# Patient Record
Sex: Female | Born: 1975 | Race: White | Hispanic: No | Marital: Single
Health system: Southern US, Community
[De-identification: ages and names within clinical notes are randomized; demographics above are authoritative.]

---

## 2013-08-09 LAB — OB RESULTS CONSOLE HGB/HCT, BLOOD
HCT: 38 %
Hemoglobin: 13.1 g/dL

## 2013-08-09 LAB — OB RESULTS CONSOLE ABO/RH: RH TYPE: POSITIVE

## 2013-08-09 LAB — OB RESULTS CONSOLE RUBELLA ANTIBODY, IGM: Rubella: IMMUNE

## 2013-08-09 LAB — OB RESULTS CONSOLE RPR: RPR: NONREACTIVE

## 2013-08-09 LAB — OB RESULTS CONSOLE HEPATITIS B SURFACE ANTIGEN: HEP B S AG: NEGATIVE

## 2013-08-09 LAB — OB RESULTS CONSOLE HIV ANTIBODY (ROUTINE TESTING): HIV: NONREACTIVE

## 2013-08-09 LAB — OB RESULTS CONSOLE PLATELET COUNT: Platelets: 260 10*3/uL

## 2013-08-09 LAB — OB RESULTS CONSOLE ANTIBODY SCREEN: Antibody Screen: NEGATIVE

## 2013-11-22 ENCOUNTER — Other Ambulatory Visit: Payer: Medicaid Other

## 2013-11-22 DIAGNOSIS — Z3490 Encounter for supervision of normal pregnancy, unspecified, unspecified trimester: Secondary | ICD-10-CM

## 2013-11-22 DIAGNOSIS — Z3689 Encounter for other specified antenatal screening: Secondary | ICD-10-CM

## 2013-11-22 LAB — CBC
HCT: 37 % (ref 36.0–46.0)
HEMOGLOBIN: 13.1 g/dL (ref 12.0–15.0)
MCH: 29.9 pg (ref 26.0–34.0)
MCHC: 35.4 g/dL (ref 30.0–36.0)
MCV: 84.5 fL (ref 78.0–100.0)
Platelets: 253 10*3/uL (ref 150–400)
RBC: 4.38 MIL/uL (ref 3.87–5.11)
RDW: 13.3 % (ref 11.5–15.5)
WBC: 11.7 10*3/uL — ABNORMAL HIGH (ref 4.0–10.5)

## 2013-11-22 NOTE — Progress Notes (Unsigned)
Anatomy US scheduled for July 24th

## 2013-11-23 LAB — HIV ANTIBODY (ROUTINE TESTING W REFLEX): HIV 1&2 Ab, 4th Generation: NONREACTIVE

## 2013-11-23 LAB — GLUCOSE TOLERANCE, 1 HOUR (50G) W/O FASTING: Glucose, 1 Hour GTT: 92 mg/dL (ref 70–140)

## 2013-11-23 LAB — RPR

## 2013-11-24 DIAGNOSIS — O09522 Supervision of elderly multigravida, second trimester: Secondary | ICD-10-CM

## 2013-11-24 DIAGNOSIS — O09529 Supervision of elderly multigravida, unspecified trimester: Secondary | ICD-10-CM | POA: Insufficient documentation

## 2013-11-24 DIAGNOSIS — D219 Benign neoplasm of connective and other soft tissue, unspecified: Secondary | ICD-10-CM | POA: Insufficient documentation

## 2013-11-24 DIAGNOSIS — I471 Supraventricular tachycardia: Secondary | ICD-10-CM | POA: Insufficient documentation

## 2013-11-24 DIAGNOSIS — D259 Leiomyoma of uterus, unspecified: Secondary | ICD-10-CM

## 2013-11-24 DIAGNOSIS — E039 Hypothyroidism, unspecified: Secondary | ICD-10-CM | POA: Insufficient documentation

## 2013-11-26 ENCOUNTER — Other Ambulatory Visit: Payer: Self-pay | Admitting: Obstetrics & Gynecology

## 2013-11-26 ENCOUNTER — Ambulatory Visit (HOSPITAL_COMMUNITY)
Admission: RE | Admit: 2013-11-26 | Discharge: 2013-11-26 | Disposition: A | Discharge status: Home / Self Care | Payer: Medicaid Other | Source: Ambulatory Visit | Attending: Obstetrics & Gynecology | Admitting: Obstetrics & Gynecology

## 2013-11-26 DIAGNOSIS — Z3689 Encounter for other specified antenatal screening: Secondary | ICD-10-CM

## 2013-12-01 ENCOUNTER — Encounter: Payer: Self-pay | Admitting: *Deleted

## 2013-12-02 ENCOUNTER — Telehealth: Payer: Self-pay | Admitting: General Practice

## 2013-12-02 NOTE — Telephone Encounter (Signed)
Patient called and left message stating she had labs and an ultrasound done over a week ago and would like her results. Called patient, no answer- left message that we are trying to return your phone call, please call us back at the clinics

## 2013-12-02 NOTE — Telephone Encounter (Signed)
Patient called back and left message stating she is trying to get her results. Called patient back and informed her of normal results and ultrasound. Patient verbalized understanding and had no other questions

## 2013-12-09 ENCOUNTER — Encounter: Payer: Medicaid Other | Admitting: Family

## 2014-01-04 ENCOUNTER — Encounter: Payer: Self-pay | Admitting: Obstetrics & Gynecology

## 2014-01-04 ENCOUNTER — Encounter: Payer: Medicaid Other | Admitting: Obstetrics & Gynecology

## 2014-01-11 ENCOUNTER — Encounter: Payer: Self-pay | Admitting: General Practice

## 2014-09-29 ENCOUNTER — Encounter (HOSPITAL_COMMUNITY): Payer: Self-pay | Admitting: *Deleted

## 2016-06-28 IMAGING — US US OB DETAIL+14 WK
1 series · 12 of 28 positions shown · non-contrast
Comparison: none

[Series 1: us ob comp +14 wk · 74 acquisitions, 12 frames shown]
[im 3/74]
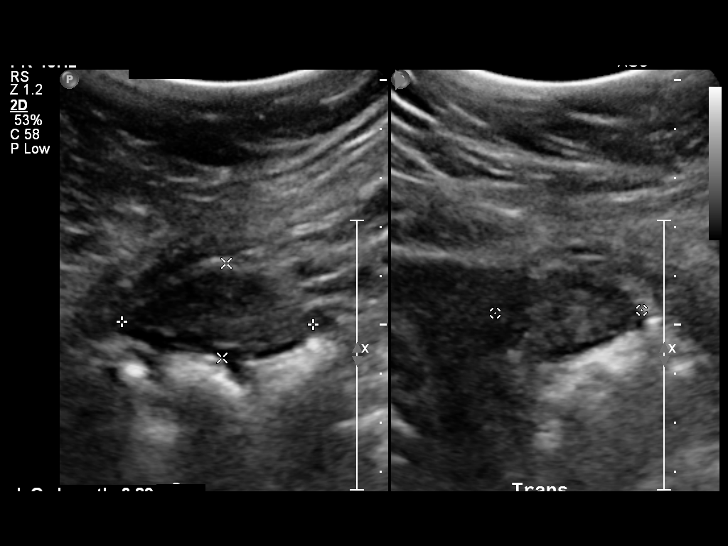
[im 9/74]
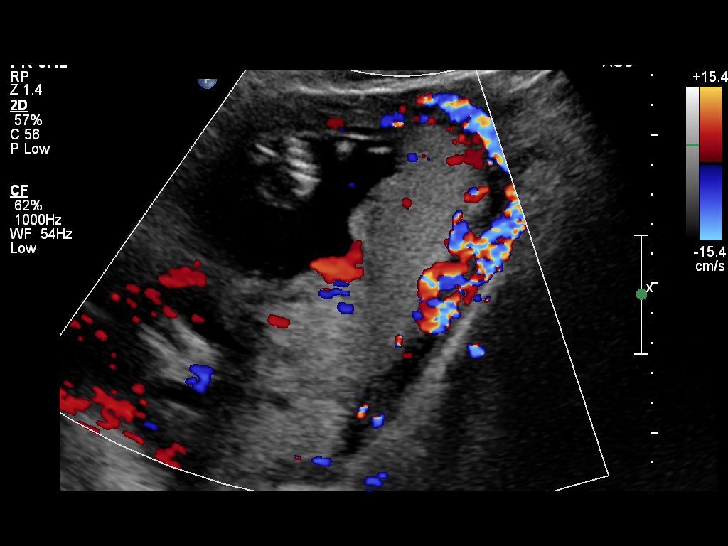
[im 14/74]
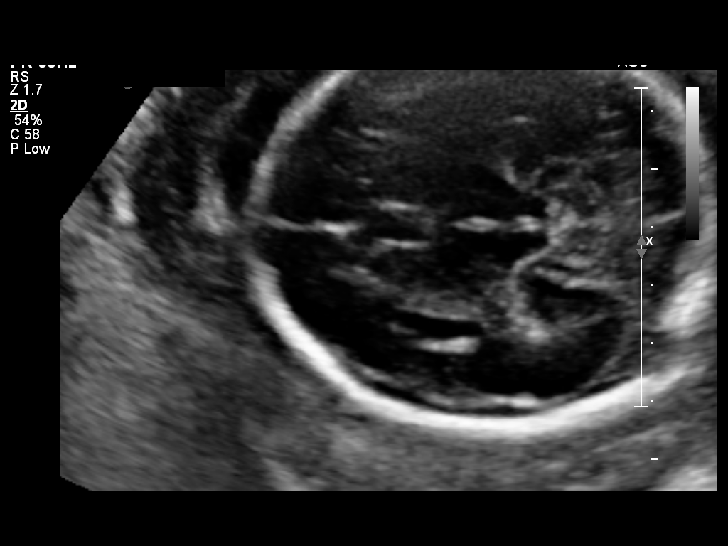
[im 22/74]
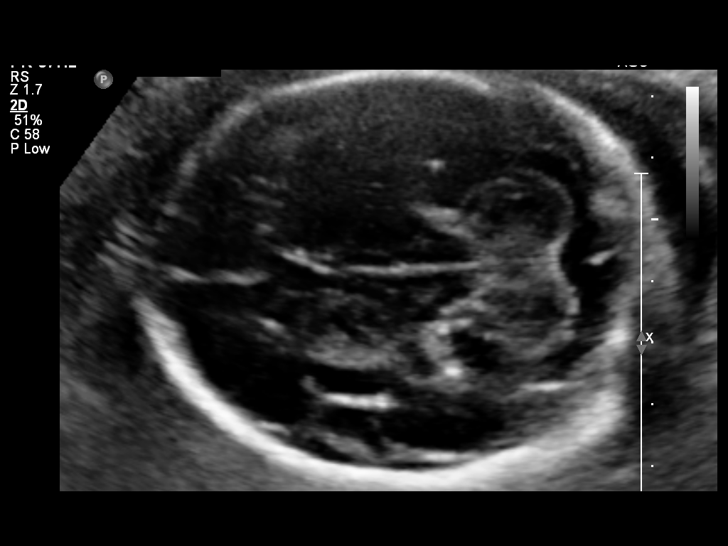
[im 28/74]
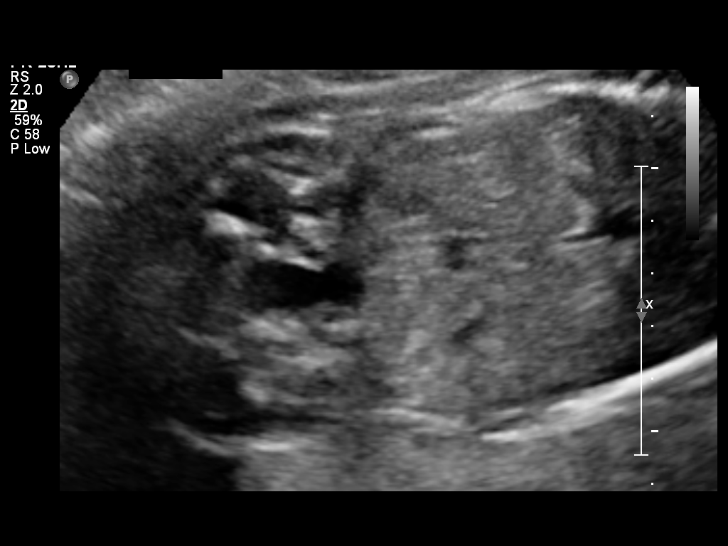
[im 33/74]
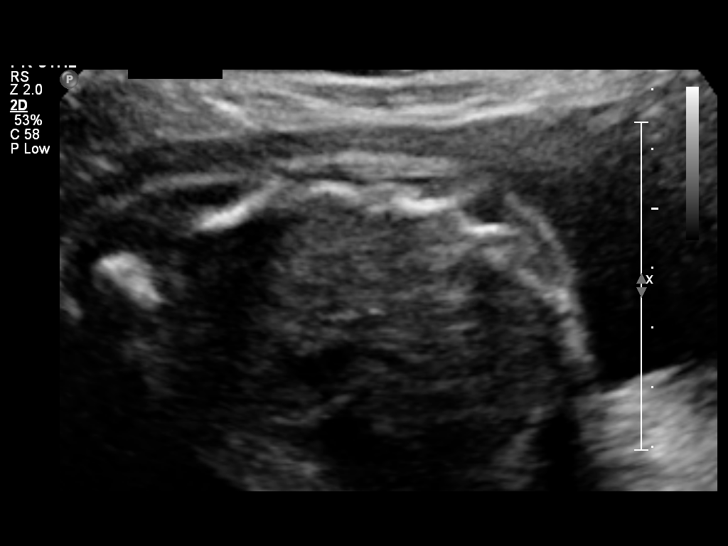
[im 41/74]
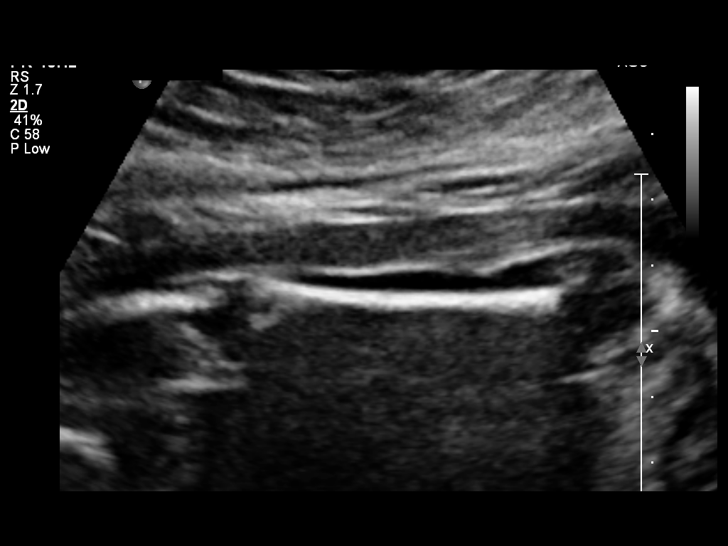
[im 46/74]
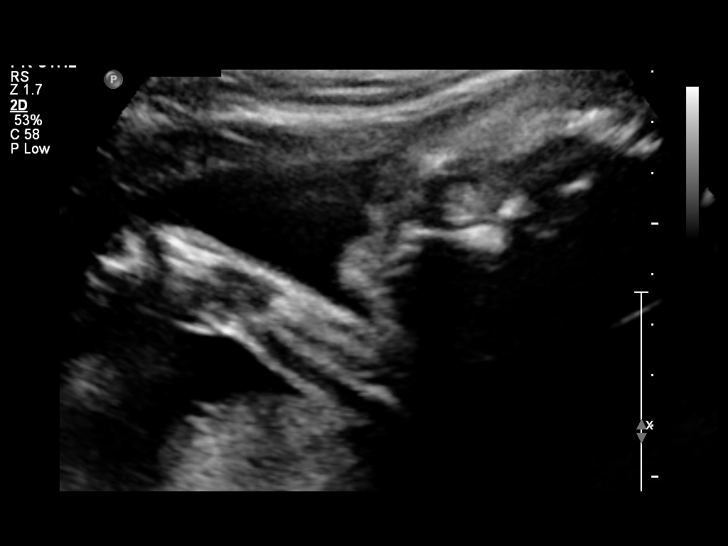
[im 52/74]
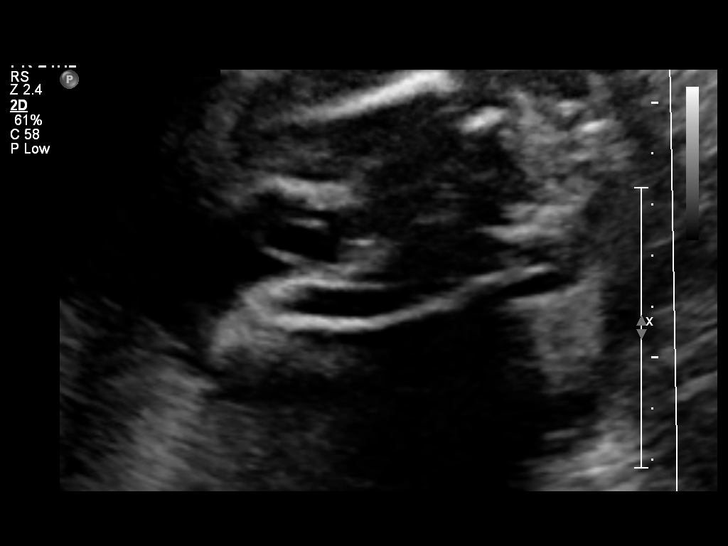
[im 60/74]
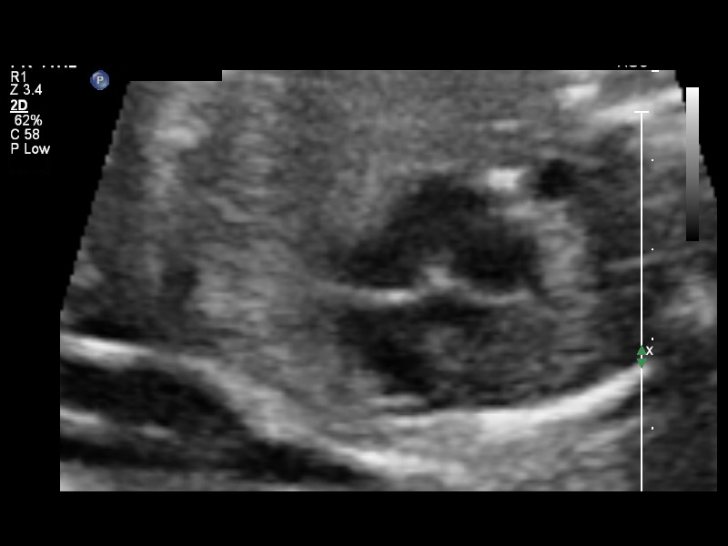
[im 65/74]
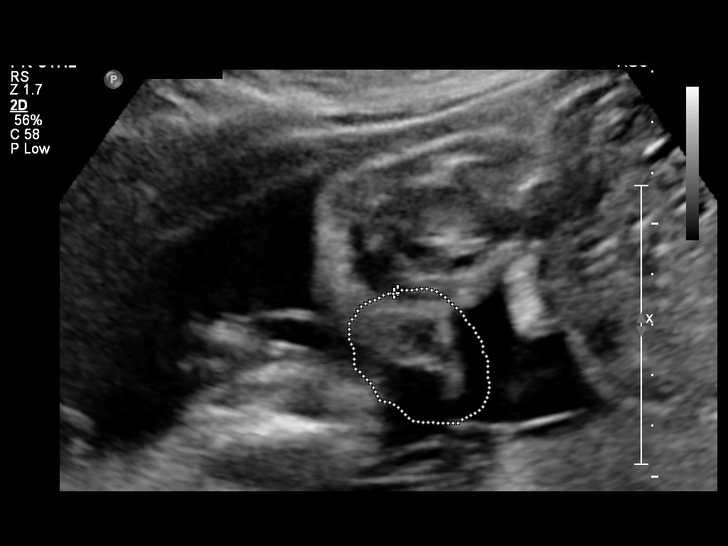
[im 71/74]
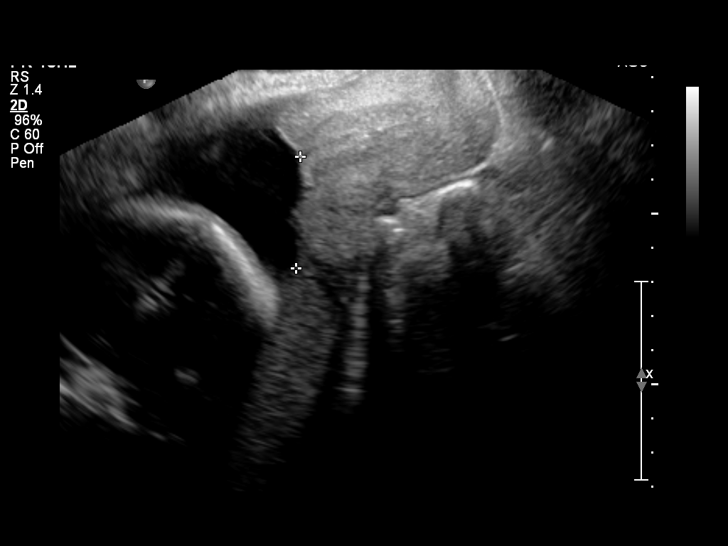

[12 of 28 positions shown; findings below may reference images not displayed]

OBSTETRICS REPORT
                    (Corrected Final 12/16/2013 [DATE])

Service(s) Provided

 US OB DETAIL + 14 WK                                  76811.0
 US MFM OB TRANSVAGINAL                                76817.2
Indications

 Basic anatomic survey
 Advanced maternal age (AMA), Primigravida
 Placenta previa/Low lying: No bleeding
Fetal Evaluation

 Num Of Fetuses:    1
 Fetal Heart Rate:  152                          bpm
 Cardiac Activity:  Observed
 Presentation:      Cephalic
 Placenta:          Posterior, above cervical
                    os
 P. Cord            Visualized, central
 Insertion:

 Amniotic Fluid
 AFI FV:      Subjectively within normal limits
                                             Larg Pckt:    4.62  cm
Biometry

 BPD:     67.1  mm     G. Age:  27w 0d                CI:        73.13   70 - 86
                                                      FL/HC:      20.6   18.6 -

 HC:     249.4  mm     G. Age:  27w 0d       41  %    HC/AC:      1.10   1.05 -

 AC:     225.9  mm     G. Age:  27w 0d       55  %    FL/BPD:     76.6   71 - 87
 FL:      51.4  mm     G. Age:  27w 3d       63  %    FL/AC:      22.8   20 - 24
 HUM:     46.9  mm     G. Age:  27w 4d       70  %
 CER:     32.4  mm     G. Age:  28w 3d       80  %

 Est. FW:    8441  gm      2 lb 5 oz     66  %
Gestational Age

 LMP:           27w 0d        Date:  05/21/13                 EDD:   02/25/14
 Clinical EDD:  26w 4d                                        EDD:   02/28/14
 U/S Today:     27w 1d                                        EDD:   02/24/14
 Best:          26w 4d     Det. By:  Clinical EDD             EDD:   02/28/14
Anatomy

 Cranium:          Appears normal         Aortic Arch:      Not well visualized
 Fetal Cavum:      Appears normal         Ductal Arch:      Not well visualized
 Ventricles:       Appears normal         Diaphragm:        Not well visualized
 Choroid Plexus:   Not well visualized    Stomach:          Appears normal, left
                                                            sided
 Cerebellum:       Appears normal         Abdomen:          Appears normal
 Posterior Fossa:  Appears normal         Abdominal Wall:   Appears nml (cord
                                                            insert, abd wall)
 Nuchal Fold:      Not applicable (>20    Cord Vessels:     Appears normal (3
                   wks GA)                                  vessel cord)
 Face:             Orbits appear          Kidneys:          Appear normal
                   normal
 Lips:             Appears normal         Bladder:          Appears normal
 Heart:            Not well visualized    Spine:            Appears normal
 RVOT:             Not well visualized    Lower             Visualized
                                          Extremities:
 LVOT:             Not well visualized    Upper             Visualized
                                          Extremities:

 Other:  Male gender. Technically difficult due to maternal habitus and fetal
         position.
Targeted Anatomy

 Fetal Central Nervous System
 Lat. Ventricles:  4.4                    Cisterna Magna:
Cervix Uterus Adnexa

 Cervical Length:    4.53     cm

 Cervix:       Normal appearance by transvaginal scan
 Left Ovary:    Size(cm) L: 3.89 x W: 2.98 x H: 1.92  Volume(cc):

 Right Ovary:   Not visualized.
 Adnexa:     No abnormality visualized. No adnexal mass visualized.
Impression

 SIUP at 16w4d
 EFW 66th%
 No structural defects demonstrated, limitations as
 documented above
 No previa
 Cervix is long and closed.
Recommendations

 Recommend follow up attempt to complete survey in 6-8
 weeks.

                 Attending Physician, MANGUAL
